# Patient Record
Sex: Male | Born: 1985 | Race: White | Hispanic: No | Marital: Married | Smoking: Never smoker
Health system: Southern US, Community
[De-identification: ages and names within clinical notes are randomized; demographics above are authoritative.]

## PROBLEM LIST (undated history)

## (undated) HISTORY — PX: LASIK: SHX215

---

## 2016-06-30 ENCOUNTER — Encounter: Payer: Self-pay | Admitting: Emergency Medicine

## 2016-06-30 ENCOUNTER — Ambulatory Visit (INDEPENDENT_AMBULATORY_CARE_PROVIDER_SITE_OTHER): Payer: 59

## 2016-06-30 ENCOUNTER — Ambulatory Visit
Admission: EM | Admit: 2016-06-30 | Discharge: 2016-06-30 | Disposition: A | Discharge status: Home / Self Care | Payer: 59 | Attending: Family Medicine | Admitting: Family Medicine

## 2016-06-30 DIAGNOSIS — S61012A Laceration without foreign body of left thumb without damage to nail, initial encounter: Secondary | ICD-10-CM

## 2016-06-30 MED ORDER — LIDOCAINE HCL (PF) 1 % IJ SOLN: 10.0000 mL | Freq: Once | INTRAMUSCULAR | Status: DC

## 2016-06-30 MED ORDER — TETANUS-DIPHTH-ACELL PERTUSSIS 5-2.5-18.5 LF-MCG/0.5 IM SUSP
0.5000 mL | Freq: Once | INTRAMUSCULAR | Status: AC
  Administered 2016-06-30: 0.5 mL via INTRAMUSCULAR

## 2016-06-30 MED ORDER — CEPHALEXIN 500 MG PO CAPS: 500.0000 mg | ORAL_CAPSULE | Freq: Four times a day (QID) | ORAL | 0 refills | Status: AC

## 2016-06-30 NOTE — ED Triage Notes (Signed)
Patient has a laceration to the top of his left thumb.  Patient has minimal bleeding at this time. Patient cut his thumb will working on his truck at home.

## 2016-06-30 NOTE — ED Provider Notes (Signed)
[facility] ____________________________________________  Time seen: Approximately 3:10 PM  I have reviewed the triage vital signs and the nursing notes.   HISTORY  Chief Complaint Extremity Laceration (left thumb)   HPI Eric Camacho is a 30 y.o. male presents with father at bedside for the complaints of left thumb laceration. Patient reports this injury occurred just prior to arrival. Reports that he was working on his truck at home. Patient reports that he was using a small small to cut a piece of metal and this all jumped back and cut his left thumb. Reports mild pain at laceration site only. Denies any other pain or injury. Denies any numbness or tingling sensation, decreased range of motion or other injury. Denies fall, head injury or loss of consciousness. Reports right hand dominant. Unsure of last tetanus immunization. Denies any other complaints. Denies concerns of foreign bodies.    History reviewed. No pertinent past medical history.  There are no active problems to display for this patient.   History reviewed. No pertinent surgical history.     Current Facility-Administered Medications:  .  lidocaine (PF) (XYLOCAINE) 1 % injection 10 mL, 10 mL, Other, Once, Renford DillsLindsey Ndia Sampath, NP  Current Outpatient Prescriptions:  .  cephALEXin (KEFLEX) 500 MG capsule, Take 1 capsule (500 mg total) by mouth 4 (four) times daily., Disp: 20 capsule, Rfl: 0  Allergies Review of patient's allergies indicates no known allergies.  History reviewed. No pertinent family history.  Social History Social History  Substance Use Topics  . Smoking status: Never Smoker  . Smokeless tobacco: Never Used  . Alcohol use No    Review of Systems Constitutional: No fever/chills Eyes: No visual changes. ENT: No sore throat. Cardiovascular: Denies chest pain. Respiratory: Denies shortness of breath. Gastrointestinal: No abdominal pain.  No nausea, no vomiting.  No diarrhea.  No  constipation. Genitourinary: Negative for dysuria. Musculoskeletal: Negative for back pain. Skin: Negative for rash.As above. Neurological: Negative for headaches, focal weakness or numbness.  10-point ROS otherwise negative.  ____________________________________________   PHYSICAL EXAM:  VITAL SIGNS: ED Triage Vitals  Enc Vitals Group     BP 06/30/16 1417 (!) 143/74     Pulse Rate 06/30/16 1417 68     Resp 06/30/16 1417 16     Temp 06/30/16 1417 97.1 F (36.2 C)     Temp Source 06/30/16 1417 Tympanic     SpO2 06/30/16 1417 99 %     Weight 06/30/16 1417 215 lb (97.5 kg)     Height 06/30/16 1417 6' (1.829 m)     Head Circumference --      Peak Flow --      Pain Score 06/30/16 1418 1     Pain Loc --      Pain Edu? --      Excl. in GC? --     Constitutional: Alert and oriented. Well appearing and in no acute distress. Eyes: Conjunctivae are normal. PERRL. EOMI. ENT      Head: Normocephalic and atraumatic.      Mouth/Throat: Mucous membranes are moist Cardiovascular: Normal rate, regular rhythm. Grossly normal heart sounds.  Good peripheral circulation. Respiratory: Normal respiratory effort without tachypnea nor retractions.  Musculoskeletal:  Ambulatory with steady gait. Neurologic:  Normal speech and language. No gross focal neurologic deficits are appreciated. Speech is normal. No gait instability.  Skin:  Skin is warm, dry and intact. No rash noted. Except: Left dorsal thumb proximal phalanx 2.6 cm linear laceration with slight jagged edges,  no foreign bodies visualized, no bone or tendon visualized, full range of motion, no motor or tendon deficit, normal distal sensation normal distal capillary refill, left hand skin otherwise intact, mild tenderness directly at laceration site, left hand otherwise nontender. Psychiatric: Mood and affect are normal. Speech and behavior are normal. Patient exhibits appropriate insight and judgment     ___________________________________________   LABS (all labs ordered are listed, but only abnormal results are displayed)  Labs Reviewed - No data to display ____________________________________________  RADIOLOGY  Dg Finger Thumb Left  Result Date: 06/30/2016 CLINICAL DATA:  Left thumb laceration EXAM: LEFT THUMB 2+V COMPARISON:  None available FINDINGS: Soft tissue injury overlying the left first MCP joint. No underlying fracture or radiopaque foreign body. No acute osseous finding or joint abnormality. IMPRESSION: Soft tissue injury.  No acute osseous finding. Electronically Signed   By: Judie PetitM.  Shick M.D.   On: 06/30/2016 15:20   ____________________________________________   PROCEDURES Procedures   Procedure(s) performed:  Procedure explained and verbal consent obtained. Consent: Verbal consent obtained. Written consent not obtained. Risks and benefits: risks, benefits and alternatives were discussed Patient identity confirmed: verbally with patient and hospital-assigned identification number  Consent given by: patient  Left hand cleaned and soaked prior to procedure by RN  Laceration Repair Location: Left thumb Length: 2.6 cm Foreign bodies: no foreign bodies Tendon involvement: none Nerve involvement: none Preparation: Patient was prepped and draped in the usual sterile fashion. Anesthesia with 1% Lidocaine 6 mls Irrigation solution: saline and Betadine  Irrigation method: jet lavage Amount of cleaning: copious Repaired with 4-0 nylon  Number of sutures: 4 Technique: simple interrupted  Approximation: loose Patient tolerate well. Wound well approximated post repair.   Antibiotic ointment and dressing applied.  Wound care instructions provided.  Observe for any signs of infection or other problems.  Finger splint applied.     INITIAL IMPRESSION / ASSESSMENT AND PLAN / ED COURSE  Pertinent labs & imaging results that were available during my care of the patient  were reviewed by me and considered in my medical decision making (see chart for details).  Well-appearing patient. No acute distress. Presents for complaints of laceration. Left hand copiously irrigated and cleaned. Left thumb x-ray per radiologist soft tissue injury, no acute osseous findings. Wound repaired. Patient tolerated well. Finger splint applied for the next 2 days and directed and use for suture support and protection. Discussed in detail regarding wound care, cleaning and maintenance. Return to urgent care at PCP office in 7-10 days for suture removal. concern for dirty wound during laceration injury, will provide place patient on cephalexin.Discussed indication, risks and benefits of medications with patient. Discussed strict return parameters.  Discussed follow up with Primary care physician this week. Discussed follow up and return parameters including no resolution or any worsening concerns. Patient verbalized understanding and agreed to plan.   ____________________________________________   FINAL CLINICAL IMPRESSION(S) / ED DIAGNOSES  Final diagnoses:  Laceration of left thumb without foreign body without damage to nail, initial encounter     New Prescriptions   CEPHALEXIN (KEFLEX) 500 MG CAPSULE    Take 1 capsule (500 mg total) by mouth 4 (four) times daily.    Note: This dictation was prepared with Dragon dictation along with smaller phrase technology. Any transcriptional errors that result from this process are unintentional.    Clinical Course      Renford DillsLindsey Maxtyn Nuzum, NP 06/30/16 1631    Renford DillsLindsey Taite Schoeppner, NP 06/30/16 [phone]

## 2016-06-30 NOTE — Discharge Instructions (Signed)
Take medication as prescribed. Rest. Keep clean as discussed.   Return to urgent care in 7-10 days for suture removal.   Follow up with your primary care physician this week as needed. Return to Urgent care for new or worsening concerns.

## 2018-06-25 IMAGING — CR DG FINGER THUMB 2+V*L*
3 series · 3 of 3 positions shown · non-contrast
Comparison: None available

CLINICAL DATA: Left thumb laceration

EXAM:
LEFT THUMB 2+V

[finger ap]
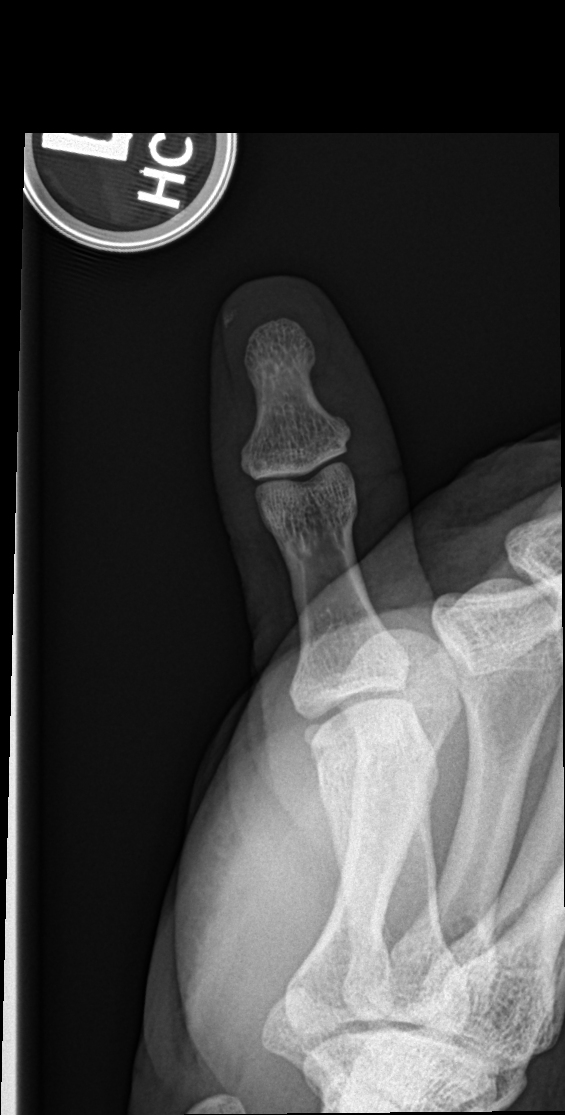

[finger obl]
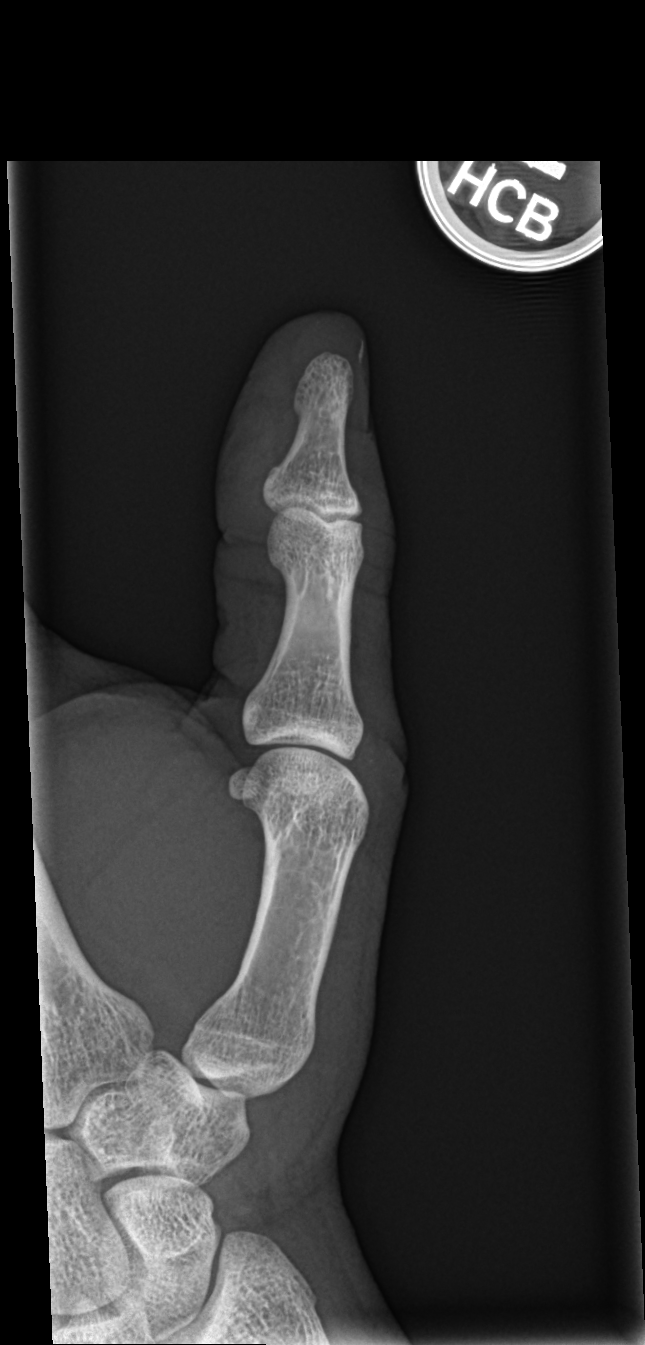

[finger lat]
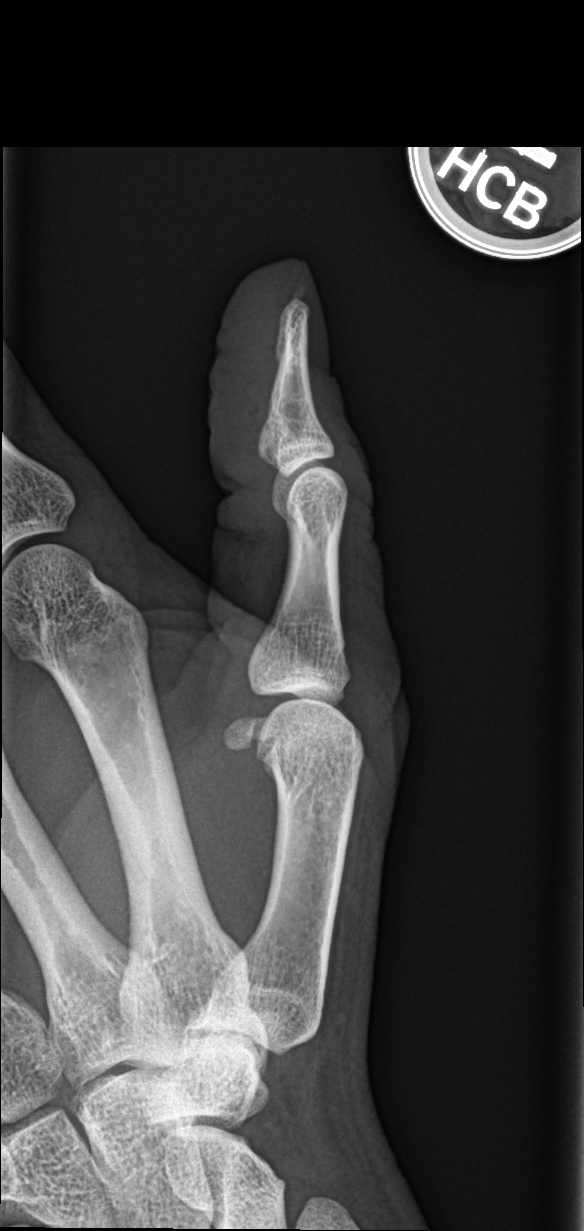

[3 of 3 positions shown; findings below may reference images not displayed]

FINDINGS: Soft tissue injury overlying the left first MCP joint. No underlying
fracture or radiopaque foreign body. No acute osseous finding or
joint abnormality.
IMPRESSION: Soft tissue injury.  No acute osseous finding.

## 2021-11-06 ENCOUNTER — Encounter: Payer: Self-pay | Admitting: Emergency Medicine

## 2021-11-06 ENCOUNTER — Ambulatory Visit
Admission: EM | Admit: 2021-11-06 | Discharge: 2021-11-06 | Disposition: A | Discharge status: Home / Self Care | Payer: 59 | Attending: Emergency Medicine | Admitting: Emergency Medicine

## 2021-11-06 ENCOUNTER — Other Ambulatory Visit: Payer: Self-pay

## 2021-11-06 DIAGNOSIS — H6501 Acute serous otitis media, right ear: Secondary | ICD-10-CM

## 2021-11-06 MED ORDER — FLUTICASONE PROPIONATE 50 MCG/ACT NA SUSP: 1.0000 | Freq: Every day | NASAL | 0 refills | Status: AC

## 2021-11-06 MED ORDER — AMOXICILLIN 500 MG PO CAPS: 500.0000 mg | ORAL_CAPSULE | Freq: Two times a day (BID) | ORAL | 0 refills | Status: AC

## 2021-11-06 NOTE — Discharge Instructions (Signed)
Your right eardrum was noted to be infected on today's exam ? ?Begin use of amoxicillin twice daily for the next 10 days to help get rid of bacteria ? ?You may use over-the-counter Tylenol and ibuprofen to further help manage pain ? ?Please discontinue any ear cleaning or object placement to the ear canal to prevent further irritation ? ?For additional measure begin use of Flonase every morning to help move any possible congestion out of the sinus passages since you were recently ill ? ?You may follow-up with urgent care as needed for persisting symptoms ?

## 2021-11-06 NOTE — ED Triage Notes (Signed)
Pt c/o right ear pain, and right sided neck pain. Started shortly after having a cold 2 weeks ago. Denies fever.   ?

## 2021-11-06 NOTE — ED Provider Notes (Signed)
?[facility] ? ? ? ?CSN: [id number] ?Arrival date & time: 11/06/21  1757 ? ? ?  ? ?History   ?Chief Complaint ?Chief Complaint  ?Patient presents with  ? Otalgia  ?  right  ? ? ?HPI ?Eric Camacho is a 36 y.o. male.  ? ?Patient presents with right-sided ear pain radiating down into the right side of neck for 1-1/2 weeks.  Symptoms began initially after a viral upper respiratory infection which resolved after 2 days.  Has not attempted treatment of symptoms.  Tolerating food and liquids.  Denies current fever, ear itching or drainage, decreased hearing.  No sick contacts prior to his symptoms beginning. ? ?History reviewed. No pertinent past medical history. ? ?There are no problems to display for this patient. ? ? ?History reviewed. No pertinent surgical history. ? ? ? ? ?Home Medications   ? ?Prior to Admission medications   ?Medication Sig Start Date End Date Taking? Authorizing Provider  ?amoxicillin (AMOXIL) 500 MG capsule Take 1 capsule (500 mg total) by mouth 2 (two) times daily for 10 days. 11/06/21 11/16/21 Yes Yaresly Menzel R, NP  ?fluticasone (FLONASE) 50 MCG/ACT nasal spray Place 1 spray into both nostrils daily. 11/06/21  Yes Valinda Hoar, NP  ? ? ?Family History ?No family history on file. ? ?Social History ?Social History  ? ?Tobacco Use  ? Smoking status: Never  ? Smokeless tobacco: Never  ?Vaping Use  ? Vaping Use: Never used  ?Substance Use Topics  ? Alcohol use: No  ? Drug use: No  ? ? ? ?Allergies   ?Patient has no known allergies. ? ? ?Review of Systems ?Review of Systems  ?Constitutional: Negative.   ?HENT:  Positive for ear pain. Negative for congestion, dental problem, drooling, ear discharge, facial swelling, hearing loss, mouth sores, nosebleeds, postnasal drip, rhinorrhea, sinus pressure, sinus pain, sneezing, sore throat, tinnitus, trouble swallowing and voice change.   ?Respiratory: Negative.    ?Cardiovascular: Negative.   ?Genitourinary: Negative.   ?Skin: Negative.    ?Neurological: Negative.   ? ? ?Physical Exam ?Triage Vital Signs ?ED Triage Vitals  ?Enc Vitals Group  ?   BP 11/06/21 1821 (!) 157/87  ?   Pulse Rate 11/06/21 1821 88  ?   Resp 11/06/21 1821 18  ?   Temp 11/06/21 1821 98.8 ?F (37.1 ?C)  ?   Temp Source 11/06/21 1821 Oral  ?   SpO2 11/06/21 1821 97 %  ?   Weight 11/06/21 1820 214 lb 15.2 oz (97.5 kg)  ?   Height 11/06/21 1820 6' (1.829 m)  ?   Head Circumference --   ?   Peak Flow --   ?   Pain Score 11/06/21 1819 7  ?   Pain Loc --   ?   Pain Edu? --   ?   Excl. in GC? --   ? ?No data found. ? ?Updated Vital Signs ?BP (!) 157/87 (BP Location: Left Arm)   Pulse 88   Temp 98.8 ?F (37.1 ?C) (Oral)   Resp 18   Ht 6' (1.829 m)   Wt 214 lb 15.2 oz (97.5 kg)   SpO2 97%   BMI 29.15 kg/m?  ? ?Visual Acuity ?Right Eye Distance:   ?Left Eye Distance:   ?Bilateral Distance:   ? ?Right Eye Near:   ?Left Eye Near:    ?Bilateral Near:    ? ?Physical Exam ?Constitutional:   ?   Appearance: Normal appearance.  ?HENT:  ?  Head: Normocephalic.  ?   Right Ear: Hearing, ear canal and external ear normal. Tympanic membrane is erythematous.  ?   Left Ear: Hearing, tympanic membrane, ear canal and external ear normal.  ?   Nose: Nose normal.  ?   Mouth/Throat:  ?   Mouth: Mucous membranes are moist.  ?   Pharynx: Oropharynx is clear.  ?Eyes:  ?   Extraocular Movements: Extraocular movements intact.  ?Pulmonary:  ?   Effort: Pulmonary effort is normal.  ?Neurological:  ?   Mental Status: He is alert and oriented to person, place, and time. Mental status is at baseline.  ?Psychiatric:     ?   Mood and Affect: Mood normal.     ?   Behavior: Behavior normal.  ? ? ? ?UC Treatments / Results  ?Labs ?(all labs ordered are listed, but only abnormal results are displayed) ?Labs Reviewed - No data to display ? ?EKG ? ? ?Radiology ?No results found. ? ?Procedures ?Procedures (including critical care time) ? ?Medications Ordered in UC ?Medications - No data to display ? ?Initial Impression  / Assessment and Plan / UC Course  ?I have reviewed the triage vital signs and the nursing notes. ? ?Pertinent labs & imaging results that were available during my care of the patient were reviewed by me and considered in my medical decision making (see chart for details). ? ?Nonrecurrent acute serous otitis media of the right ear ? ?Erythema noted to the tympanic membrane on the right side, discussed findings with patient, amoxicillin 10-day course prescribed, due to recent illness involving nasal congestion, prescribed Flonase for an additional measure, recommended over-the-counter Tylenol or ibuprofen for management of pain to symptoms have subsided, may follow-up with urgent care as needed ?Final Clinical Impressions(s) / UC Diagnoses  ? ?Final diagnoses:  ?Non-recurrent acute serous otitis media of right ear  ? ? ? ?Discharge Instructions   ? ?  ?Your right eardrum was noted to be infected on today's exam ? ?Begin use of amoxicillin twice daily for the next 10 days to help get rid of bacteria ? ?You may use over-the-counter Tylenol and ibuprofen to further help manage pain ? ?Please discontinue any ear cleaning or object placement to the ear canal to prevent further irritation ? ?For additional measure begin use of Flonase every morning to help move any possible congestion out of the sinus passages since you were recently ill ? ?You may follow-up with urgent care as needed for persisting symptoms ? ? ?ED Prescriptions   ? ? Medication Sig Dispense Auth. Provider  ? amoxicillin (AMOXIL) 500 MG capsule Take 1 capsule (500 mg total) by mouth 2 (two) times daily for 10 days. 20 capsule Avrey Flanagin R, NP  ? fluticasone (FLONASE) 50 MCG/ACT nasal spray Place 1 spray into both nostrils daily. 9.9 mL Valinda Hoar, NP  ? ?  ? ?PDMP not reviewed this encounter. ?  ?Valinda Hoar, NP ?11/06/21 1908 ? ?

## 2022-03-26 ENCOUNTER — Other Ambulatory Visit: Payer: Self-pay

## 2022-03-26 ENCOUNTER — Encounter: Payer: Self-pay | Admitting: Emergency Medicine

## 2022-03-26 ENCOUNTER — Ambulatory Visit (INDEPENDENT_AMBULATORY_CARE_PROVIDER_SITE_OTHER): Payer: 59

## 2022-03-26 ENCOUNTER — Ambulatory Visit
Admission: EM | Admit: 2022-03-26 | Discharge: 2022-03-26 | Disposition: A | Discharge status: Home / Self Care | Payer: 59 | Attending: Physician Assistant | Admitting: Physician Assistant

## 2022-03-26 DIAGNOSIS — S62316A Displaced fracture of base of fifth metacarpal bone, right hand, initial encounter for closed fracture: Secondary | ICD-10-CM | POA: Diagnosis not present

## 2022-03-26 DIAGNOSIS — M79641 Pain in right hand: Secondary | ICD-10-CM | POA: Diagnosis not present

## 2022-03-26 NOTE — ED Triage Notes (Signed)
Pt c/o right hand pain, and swelling. He states he fell this morning and landed on his hand.

## 2022-03-26 NOTE — Discharge Instructions (Signed)
-  You have fractured the fifth metacarpal bone in your hand.  It is mildly displaced and comminuted.  You will need to follow-up with orthopedics.  We have placed you in a splint.  Do not get that wet.  Keep your extremity elevated to help with swelling.  Ibuprofen and Tylenol for pain relief as needed.  Do not use affected extremity until orthopedics clears you. - Call them tomorrow to try to set up an appointment as soon as you are able to.  You have a condition requiring you to follow up with Orthopedics so please call one of the following office for appointment:   [facility] [address] [place], [place] [address] Phone: [phone]  [facility] [address] [place], [place] [address] Phone: [phone]

## 2022-03-26 NOTE — ED Provider Notes (Signed)
[facility]    CSN: [id number] Arrival date & time: 03/26/22  1735      History   Chief Complaint Chief Complaint  Patient presents with   Fall   Hand Pain    right    HPI Eric Camacho is a 36 y.o. male presenting for right hand pain and swelling since this morning.  Patient says that he fell and landed on his right hand.  He does report a lot of pain when the area is touched especially over the fifth metatarsal, when he makes a fist and when he moves his wrist.  He reports some numbness on the palmar aspect of the hand.  Of note, he is right-handed.  He says that he has applied ice and taken ibuprofen for pain.  He denies any previous fractures in this hand.  No other injuries.  HPI  History reviewed. No pertinent past medical history.  There are no problems to display for this patient.   Past Surgical History:  Procedure Laterality Date   LASIK Bilateral        Home Medications    Prior to Admission medications   Medication Sig Start Date End Date Taking? Authorizing Provider  fluticasone (FLONASE) 50 MCG/ACT nasal spray Place 1 spray into both nostrils daily. 11/06/21   Valinda Hoar, NP    Family History No family history on file.  Social History Social History   Tobacco Use   Smoking status: Never   Smokeless tobacco: Never  Vaping Use   Vaping Use: Never used  Substance Use Topics   Alcohol use: No   Drug use: No     Allergies   Patient has no known allergies.   Review of Systems Review of Systems  Musculoskeletal:  Positive for arthralgias and joint swelling.  Skin:  Negative for color change and wound.  Neurological:  Positive for numbness. Negative for weakness.     Physical Exam Triage Vital Signs ED Triage Vitals  Enc Vitals Group     BP 03/26/22 1815 (!) 144/87     Pulse Rate 03/26/22 1815 60     Resp 03/26/22 1815 18     Temp 03/26/22 1815 98.4 F (36.9 C)     Temp Source 03/26/22 1815 Oral     SpO2 03/26/22  1815 99 %     Weight 03/26/22 1814 214 lb 15.2 oz (97.5 kg)     Height 03/26/22 1814 6' (1.829 m)     Head Circumference --      Peak Flow --      Pain Score 03/26/22 1813 4     Pain Loc --      Pain Edu? --      Excl. in GC? --    No data found.  Updated Vital Signs BP (!) 144/87 (BP Location: Left Arm)   Pulse 60   Temp 98.4 F (36.9 C) (Oral)   Resp 18   Ht 6' (1.829 m)   Wt 214 lb 15.2 oz (97.5 kg)   SpO2 99%   BMI 29.15 kg/m      Physical Exam Vitals and nursing note reviewed.  Constitutional:      General: He is not in acute distress.    Appearance: Normal appearance. He is well-developed. He is not ill-appearing.  HENT:     Head: Normocephalic and atraumatic.  Eyes:     General: No scleral icterus.    Conjunctiva/sclera: Conjunctivae normal.  Cardiovascular:  Rate and Rhythm: Normal rate.     Pulses: Normal pulses.  Pulmonary:     Effort: Pulmonary effort is normal. No respiratory distress.  Musculoskeletal:     Right hand: Swelling (moderate swelling dorsal hand) and bony tenderness (along 5th metatarsal) present. No deformity. Decreased range of motion. Normal capillary refill. Normal pulse.     Cervical back: Neck supple.     Comments: Good pulses.  Skin:    General: Skin is warm and dry.     Capillary Refill: Capillary refill takes less than 2 seconds.  Neurological:     General: No focal deficit present.     Mental Status: He is alert. Mental status is at baseline.     Motor: No weakness.     Gait: Gait normal.  Psychiatric:        Mood and Affect: Mood normal.        Behavior: Behavior normal.      UC Treatments / Results  Labs (all labs ordered are listed, but only abnormal results are displayed) Labs Reviewed - No data to display  EKG   Radiology DG Hand Complete Right  Result Date: 03/26/2022 CLINICAL DATA:  Right hand pain and swelling pain, states he fell this morning landing on his hand EXAM: RIGHT HAND - COMPLETE 3+ VIEW  COMPARISON:  None Available. FINDINGS: Osseous mineralization normal. Joint spaces preserved. Comminuted intra-articular fracture at base of fifth metacarpal, minimally displaced. Diffuse soft tissue swelling. No additional fracture, dislocation, or bone destruction. IMPRESSION: Comminuted mildly displaced intra-articular fracture at base of RIGHT fifth metacarpal. Significant soft tissue swelling RIGHT hand. Electronically Signed   By: Ulyses Southward M.D.   On: 03/26/2022 18:35    Procedures Procedures (including critical care time)  Medications Ordered in UC Medications - No data to display  Initial Impression / Assessment and Plan / UC Course  I have reviewed the triage vital signs and the nursing notes.  Pertinent labs & imaging results that were available during my care of the patient were reviewed by me and considered in my medical decision making (see chart for details).  36 year old male presenting for right hand pain after falling onto his hand this morning.  X-ray performed today shows comminuted and mildly displaced intra-articular fracture at the base of the right fifth metacarpal.  I discussed this with patient.  Patient placed in ulnar gutter splint and given sling.  Reviewed RICE guidelines.  Reviewed ibuprofen and Tylenol for pain relief.  Reviewed the importance of not using affected extremity until orthopedics clears him.  Reviewed the importance of contacting one of the Ortho offices I have listed below tomorrow to try to set up an appointment or going in person to [facility] [place].  Work note given.   Final Clinical Impressions(s) / UC Diagnoses   Final diagnoses:  Closed displaced fracture of base of fifth metacarpal bone of right hand, initial encounter  Pain of right hand     Discharge Instructions      -You have fractured the fifth metacarpal bone in your hand.  It is mildly displaced and comminuted.  You will need to follow-up with orthopedics.  We have  placed you in a splint.  Do not get that wet.  Keep your extremity elevated to help with swelling.  Ibuprofen and Tylenol for pain relief as needed.  Do not use affected extremity until orthopedics clears you. - Call them tomorrow to try to set up an appointment as soon as you are  able to.  You have a condition requiring you to follow up with Orthopedics so please call one of the following office for appointment:   [facility] [address] [place], [place] [address] Phone: [phone]  [facility] [address] [place], [place] [address] Phone: [phone]      ED Prescriptions   None    PDMP not reviewed this encounter.   Shirlee Latch, PA-C 03/26/22 1923
